# Patient Record
Sex: Female | Born: 1970 | Race: White | Hispanic: No | Marital: Married | Smoking: Never smoker
Health system: Southern US, Community
[De-identification: ages and names within clinical notes are randomized; demographics above are authoritative.]

## PROBLEM LIST (undated history)

## (undated) DIAGNOSIS — Z8742 Personal history of other diseases of the female genital tract: Secondary | ICD-10-CM

## (undated) DIAGNOSIS — L719 Rosacea, unspecified: Secondary | ICD-10-CM

## (undated) DIAGNOSIS — I8390 Asymptomatic varicose veins of unspecified lower extremity: Secondary | ICD-10-CM

## (undated) HISTORY — DX: Rosacea, unspecified: L71.9

## (undated) HISTORY — DX: Asymptomatic varicose veins of unspecified lower extremity: I83.90

---
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
## 2002-01-05 HISTORY — PX: LAPAROSCOPY: SHX197

## 2003-01-06 HISTORY — PX: ABDOMINAL HYSTERECTOMY: SHX81

## 2007-12-18 ENCOUNTER — Ambulatory Visit: Payer: Self-pay | Admitting: Family Medicine

## 2009-12-04 ENCOUNTER — Ambulatory Visit: Payer: Self-pay | Admitting: Vascular Surgery

## 2010-03-25 ENCOUNTER — Encounter (INDEPENDENT_AMBULATORY_CARE_PROVIDER_SITE_OTHER): Payer: BC Managed Care – PPO

## 2010-03-25 ENCOUNTER — Ambulatory Visit (INDEPENDENT_AMBULATORY_CARE_PROVIDER_SITE_OTHER): Payer: BC Managed Care – PPO | Admitting: Vascular Surgery

## 2010-03-25 DIAGNOSIS — I83893 Varicose veins of bilateral lower extremities with other complications: Secondary | ICD-10-CM

## 2010-03-26 NOTE — Assessment & Plan Note (Signed)
OFFICE VISIT  [name] [name], [name] [name] DOB:  11-Feb-1970                                       03/25/2010 CHART#:[id number]  The patient presents today for continued follow-up of her left leg venous pathology.  She is a very pleasant 40-year white female with a long history of progressive pain over varicosities in her left calf.  I had seen her 3 months ago, and she has been wearing thigh-high graduated compression stockings since that time.  She reports no relief of her discomfort.  She does continue to elevate her legs and does take ibuprofen for pain.  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 She also reports exercise, especially tennis, is difficult due to leg pain as well and often she reports this awakens her from sleep at night. Her physical exam is unchanged.  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  She underwent formal venous duplex today in our office, and this showed reflux throughout her small saphenous vein on the left extending into the varicosities.  She does have some reflux at the left saphenofemoral junction, but the vein throughout her thigh appears small and I do not feel this is causing her venous hypertension. I have recommended laser ablation of her small saphenous and stab phlebectomy of multiple tributary varicosities for relief of symptoms. We will proceed with this at her earliest convenience.    Larina Earthly, M.D. Electronically Signed  TFE/MEDQ  D:  03/25/2010  T:  03/26/2010  Job:  [id number]

## 2010-04-01 NOTE — Procedures (Unsigned)
LOWER EXTREMITY VENOUS REFLUX EXAM  INDICATION:  Varicose veins.  EXAM:  Using color-flow imaging and pulse Doppler spectral analysis, the left common femoral, superficial femoral, popliteal, posterior tibial, greater and lesser saphenous veins were evaluated.  There is evidence suggesting deep venous insufficiency in the left lower extremity.  The left saphenofemoral junction is not competent with reflux of >537milliseconds. The left GSV is not competent with Reflux of >511milliseconds with the caliber as described below.  The left proximal small saphenous vein demonstrates incompetency.  The measurements range at 0.59 at the junction, 0.65 within the mid proximal calf, and 0.75 within the distal proximal calf.  GSV Diameter (used if found to be incompetent only)                                           Right        Left Proximal Greater Saphenous Vein           cm           0.45 cm Proximal-to-mid-thigh                     cm           cm Mid thigh                                 cm           cm Mid-distal thigh                          cm           cm Distal thigh                              cm           0.27 cm Knee                                      cm           cm  IMPRESSION: 1. Left greater saphenous vein is not competent with reflux     >553milliseconds. 2. The left greater saphenous vein is not tortuous. 3. The deep venous system is not competent with Reflux of     >568milliseconds. 4. The left small saphenous vein is not competent with Reflux of     >557milliseconds.       ___________________________________________ Larina Earthly, M.D.  OD/MEDQ  D:  03/26/2010  T:  03/26/2010  Job:  [id number]

## 2010-04-09 ENCOUNTER — Other Ambulatory Visit (INDEPENDENT_AMBULATORY_CARE_PROVIDER_SITE_OTHER): Payer: BC Managed Care – PPO | Admitting: Vascular Surgery

## 2010-04-09 DIAGNOSIS — I83893 Varicose veins of bilateral lower extremities with other complications: Secondary | ICD-10-CM

## 2010-04-09 HISTORY — PX: ENDOVENOUS ABLATION SAPHENOUS VEIN W/ LASER: SUR449

## 2010-04-10 NOTE — Assessment & Plan Note (Signed)
OFFICE VISIT  [name] [name], [name] [name] DOB:  November 16, 1970                                       04/09/2010 CHART#:[id number]  Patient underwent treatment today of her left small saphenous vein with ablation from the mid calf to just below the popliteal junction with a small saphenous vein and also 10-20 stab phlebectomies of her posterior calf and medial calf.  She had no immediate complication and will see [place] again in 1 week with repeat duplex evaluation.    Larina Earthly, M.D. Electronically Signed  TFE/MEDQ  D:  04/09/2010  T:  04/10/2010  Job:  [id number]

## 2010-04-16 ENCOUNTER — Encounter (INDEPENDENT_AMBULATORY_CARE_PROVIDER_SITE_OTHER): Payer: BC Managed Care – PPO

## 2010-04-16 ENCOUNTER — Ambulatory Visit (INDEPENDENT_AMBULATORY_CARE_PROVIDER_SITE_OTHER): Payer: BC Managed Care – PPO | Admitting: Vascular Surgery

## 2010-04-16 DIAGNOSIS — I83893 Varicose veins of bilateral lower extremities with other complications: Secondary | ICD-10-CM

## 2010-04-16 DIAGNOSIS — Z48812 Encounter for surgical aftercare following surgery on the circulatory system: Secondary | ICD-10-CM

## 2010-04-17 NOTE — Assessment & Plan Note (Signed)
OFFICE VISIT  [name], [name] DOB:  Jul 14, 1970                                       04/16/2010 CHART#:[id number]  This patient is here today for one week follow-up following laser ablation of her left small saphenous vein, stab phlebectomy of multiple tributary varicosities in her calf.  She done quite well, has minimal discomfort and quite pleased with her result.  She does have the usual amount of mild bruising.  She underwent noninvasive vascular laboratory studies in our office today and this reveals closure of her small saphenous vein from the insertion site to just below the popliteal junction.  She has no evidence of DVT.  I am pleased with the result and we will see her again on an as-needed basis.    Larina Earthly, M.D. Electronically Signed  TFE/MEDQ  D:  04/16/2010  T:  04/17/2010  Job:  [id number]  cc:   Barry Brunner

## 2010-04-29 NOTE — Procedures (Unsigned)
DUPLEX DEEP VENOUS EXAM - LOWER EXTREMITY  INDICATION:  Status post left SSV ablation.  HISTORY:  Edema:  Yes. Trauma/Surgery:  Venous ablation. Pain:  Yes. PE:  No. Previous DVT:  No. Anticoagulants:  No. Other:  DUPLEX EXAM:               CFV   SFV   PopV  PTV    GSV               R  L  R  L  R  L  R   L  R  L Thrombosis                   0      0 Spontaneous                  +      + Phasic                       +      + Augmentation                 +      + Compressible                 +      + Competent                    +      +  Legend:  + - yes  o - no  p - partial  D - decreased  IMPRESSION:  Successful ablation of left short saphenous vein without evidence of deep venous involvement.   _____________________________ Larina Earthly, M.D.  LT/MEDQ  D:  04/16/2010  T:  04/16/2010  Job:  [id number]

## 2010-05-20 NOTE — Consult Note (Signed)
NEW PATIENT CONSULTATION   [name], [name]  DOB:  28-Jan-1970                                       12/04/2009  CHART#:[id number]   This patient presents today for evaluation of left leg venous  hypertension.  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  She reports she has pain  specifically over the varicosities themselves and also a tired achy  sensation with prolonged standing.  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  She has no  other major medical difficulties and no history of DVT or bleeding from  her varicosities.  She does have history of adenoid surgery in 1978,  laparoscopy in 2004 and hysterectomy in 2005.  She does not smoke, has 1  to 2 alcohol drinks per week.   FAMILY HISTORY:  Significant for heart disease in her father and  varicosities in her mother.   REVIEW OF SYSTEMS:  Positive for pain in her legs with walking as per  HPI.  Skin:  She does have rosacea on her face.  Otherwise review of  systems is totally normal except for HPI.   PHYSICAL EXAMINATION:  Well developed, well nourished white female  appearing stated age in no acute distress.  Blood pressure 109/74, pulse  76,  respirations 18.  HEENT is normal.  Her radial and dorsalis pedis  pulses are 2+ bilaterally.  Musculoskeletal:  Shows no major deformities  or cyanosis.  Neurologic:  No focal weakness or paresthesias.  Skin:  Without ulcers or rashes.  She does have tributary varicosities in her  posterior calf on the right and extending around to the medial calf as  well.   She underwent screening ultrasound visualization by myself with SonoSite  and revealed an enlarged saphenous vein with reflux extending into the  varicosities in her left calf.  I discussed the significance of this.  I  explained  the relationship of her venous hypertension due to valvular  incompetence.  We have fitted today with thigh-high graduated  compression garments, 20 to 30 mmHg and instructed her on the use of  these.  Plan to see her again in 3 months to determine if she is having  successful treatment with conservative treatment.  I did explain the  option of laser ablation of her saphenous vein and stab phlebectomy of  tributary varicosities if she should fail conservative treatment.  She  will have a formal left leg venous duplex for reflux in evaluation with  the next visit as well.     Larina Earthly, M.D.  Electronically Signed   TFE/MEDQ  D:  12/04/2009  T:  12/05/2009  Job:  [id number]   cc:   Barry Brunner

## 2012-01-13 ENCOUNTER — Encounter: Payer: Self-pay | Admitting: *Deleted

## 2012-01-13 ENCOUNTER — Encounter: Payer: Self-pay | Admitting: Vascular Surgery

## 2012-01-14 ENCOUNTER — Ambulatory Visit (INDEPENDENT_AMBULATORY_CARE_PROVIDER_SITE_OTHER): Payer: BC Managed Care – PPO | Admitting: Vascular Surgery

## 2012-01-14 ENCOUNTER — Encounter: Payer: Self-pay | Admitting: Vascular Surgery

## 2012-01-14 VITALS — BP 120/77 | HR 83 | Resp 18 | Ht 68.0 in | Wt 194.0 lb

## 2012-01-14 DIAGNOSIS — I83893 Varicose veins of bilateral lower extremities with other complications: Secondary | ICD-10-CM | POA: Insufficient documentation

## 2012-01-14 NOTE — Progress Notes (Signed)
The patient presents today for evaluation of left leg venous pathology. She is well-known to me from a prior treatment of painful varicosities in her posterior left calf. She underwent laser ablation of her left small saphenous vein and stab phlebectomy of tributary varicosities by myself in April of 2012. She's had a good persistent result from this. She did have some telangiectasia over her calf and anterior thigh. She has had sclerotherapy treatment of these and in her dermatology office. She is seen today because she is having suboptimal results for these cosmetic concern. We are seeing her to determine there are no underlying venous pathology causing ongoing problems.  Past Medical History  Diagnosis Date  . Varicose veins   . Rosacea     History  Substance Use Topics  . Smoking status: Never Smoker   . Smokeless tobacco: Never Used  . Alcohol Use: Yes     Comment: 1-2 drinks/week    Family History  Problem Relation Age of Onset  . Heart disease Father     Allergies  Allergen Reactions  . Penicillins Hives    Current outpatient prescriptions:estrogens, conjugated, (PREMARIN) 0.625 MG tablet, Take 0.625 mg by mouth daily. Take daily for 21 days then do not take for 7 days., Disp: , Rfl: ;  loratadine (CLARITIN) 10 MG tablet, Take 10 mg by mouth daily., Disp: , Rfl:   BP 120/77  Pulse 83  Resp 18  Ht 5\' 8"  (1.727 m)  Wt 194 lb (87.998 kg)  BMI 29.50 kg/m2  Body mass index is 29.50 kg/(m^2).       Well-developed well-nourished white female no acute distress 2+ dorsalis pedis pulses bilaterally No evidence of varicosities bilaterally. She does have a venous blush over the medial calf on the left and also on the anterior thigh on the left.  I imaged her veins on the left leg with the SonoSite. This shows closure of her small saphenous vein. She does not have any evidence of varicosities underlying these areas of venous blush over her calf or thigh. Her prior study did  show mild and significant reflux at the left saphenofemoral junction  I discussed this with the patient. I explained it is somewhat difficult treating these venous blush is since difficult to get a needle into them since they are so small. I did explain the cutaneous laser is used to the treatment of choice with this. I do not see any other underlying venous pathology that should be treated. She was reassured this discussion will continue followup with her dermatology office for treatment of these venous blushes.

## 2013-08-01 ENCOUNTER — Ambulatory Visit: Payer: Self-pay | Admitting: Podiatry

## 2015-05-14 IMAGING — US US EXTREM LOW VENOUS*L*
1 series · 13 of 24 positions shown · non-contrast
Comparison: None.

CLINICAL DATA: Left calf pain and swelling.



[Series 1: us extrem low venous*left* · 0.09mm/px · 13 of 32 slices shown]
[im 1/32]
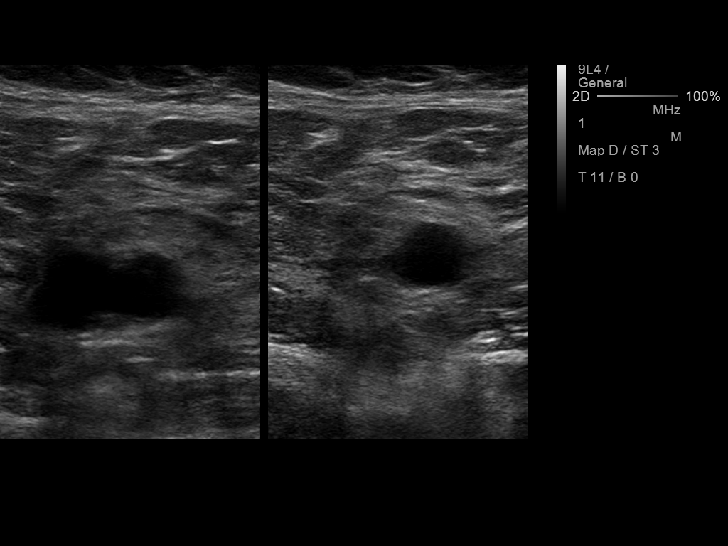
[im 3/32]
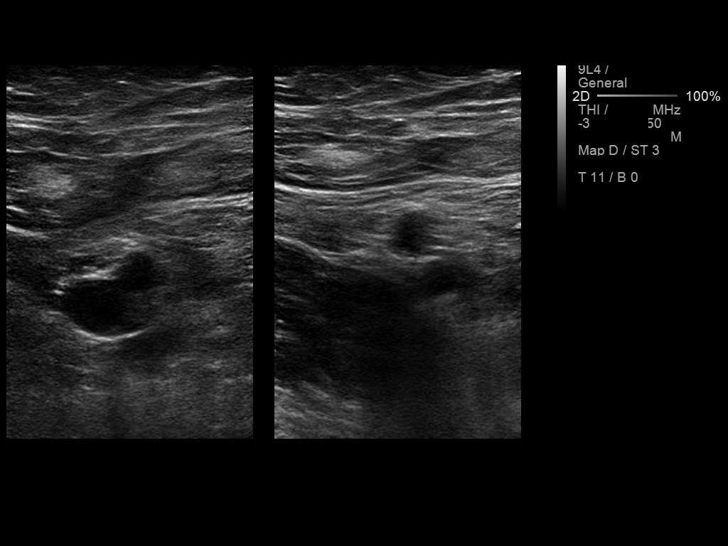
[im 6/32]
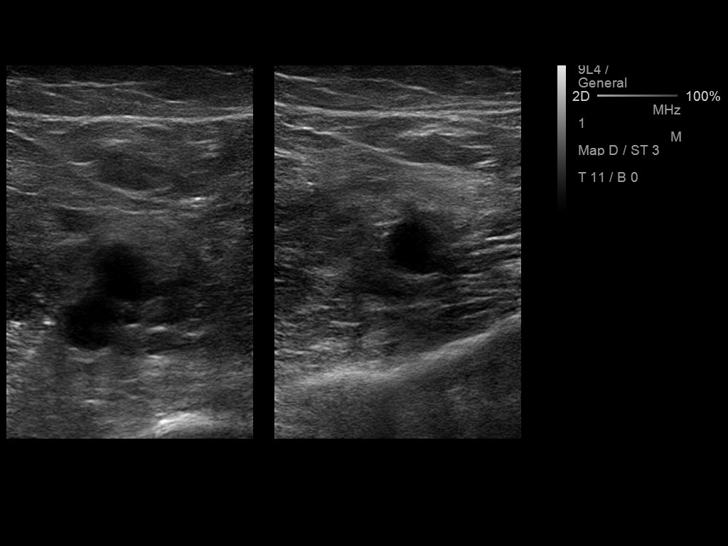
[im 9/32]
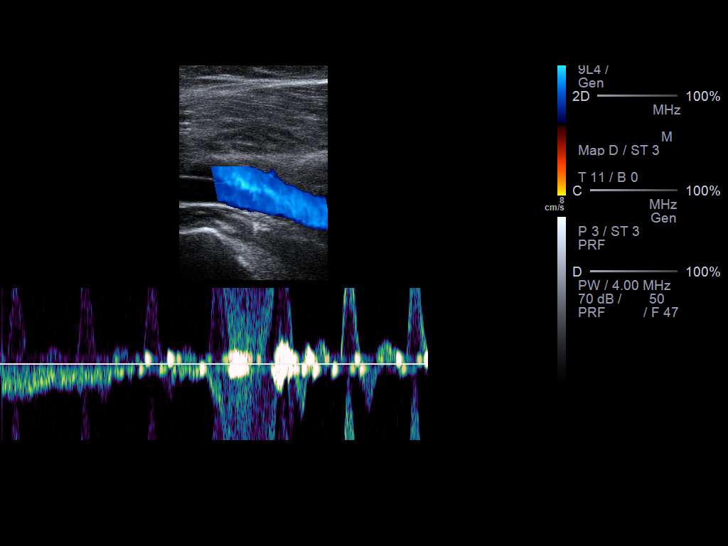
[im 11/32]
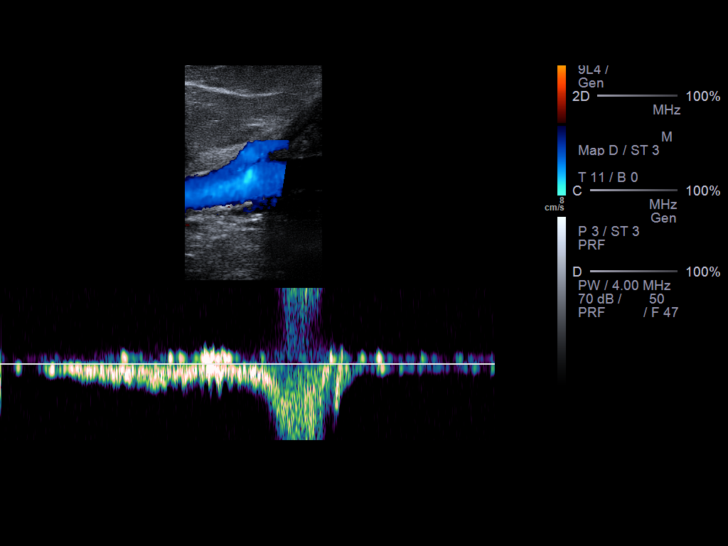
[im 14/32]
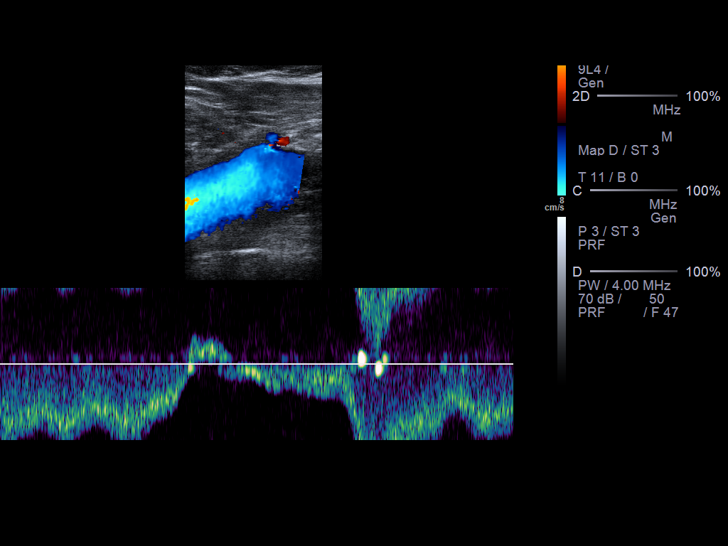
[im 17/32]
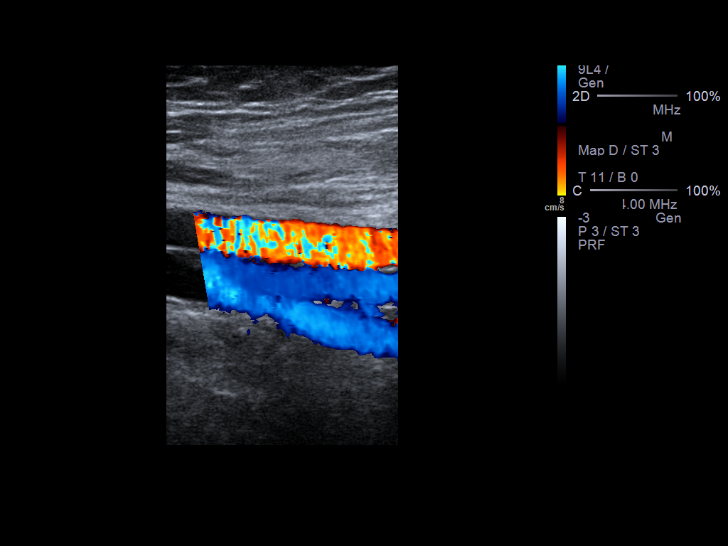
[im 18/32]
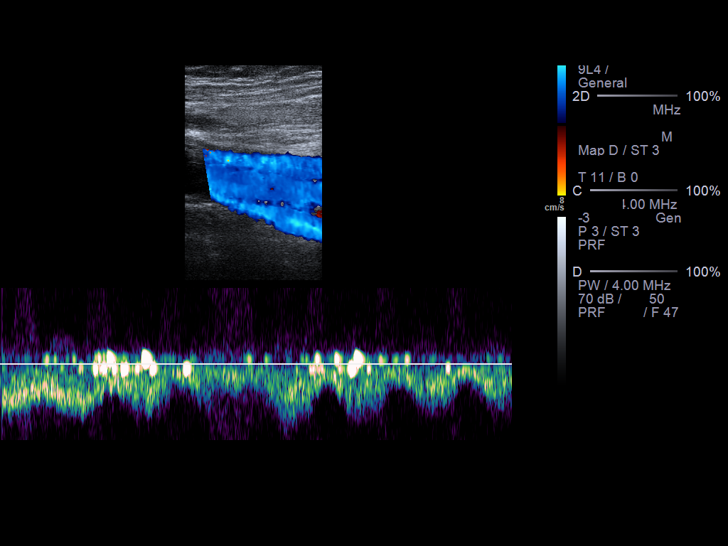
[im 21/32]
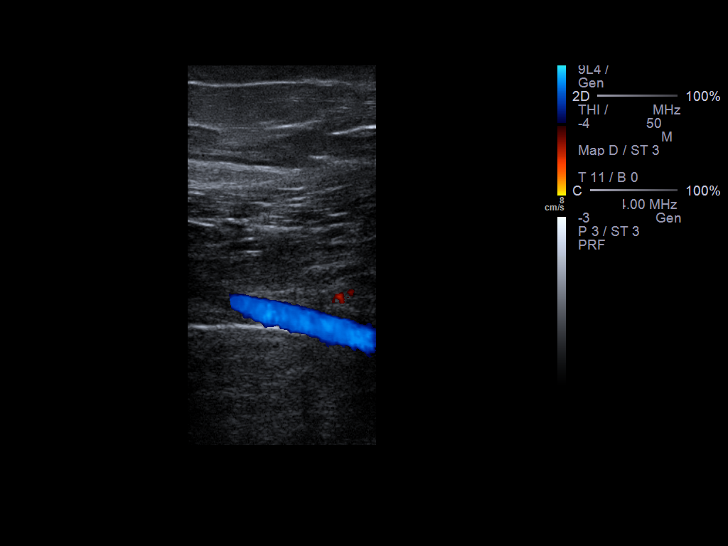
[im 23/32]
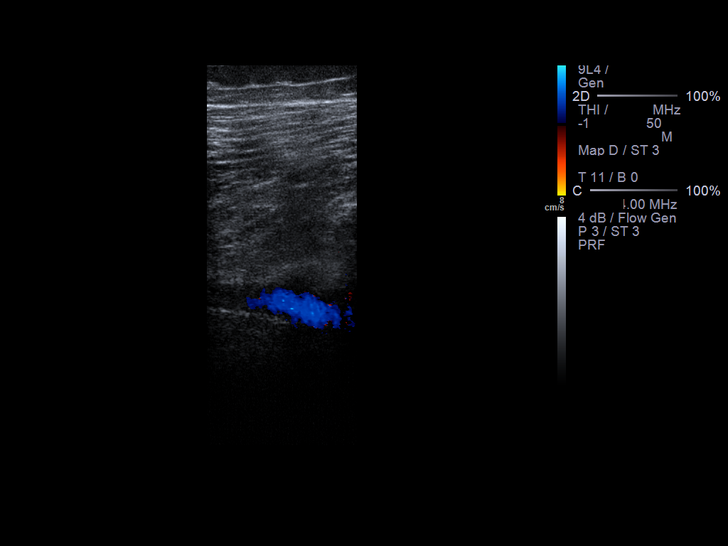
[im 26/32]
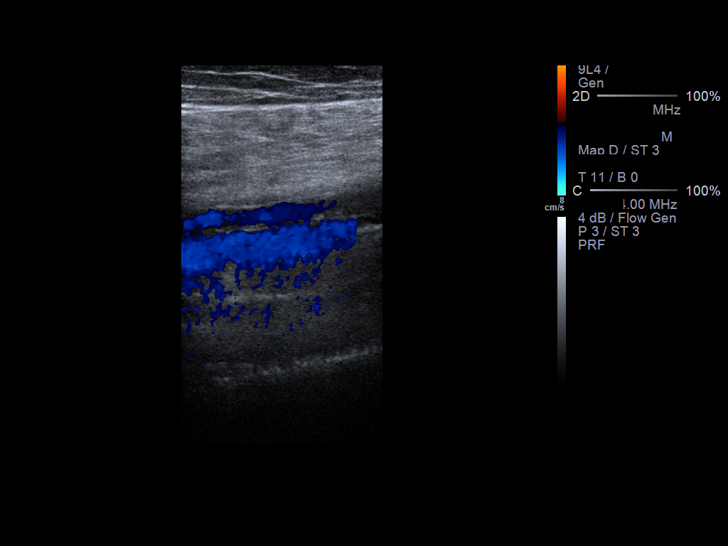
[im 29/32]
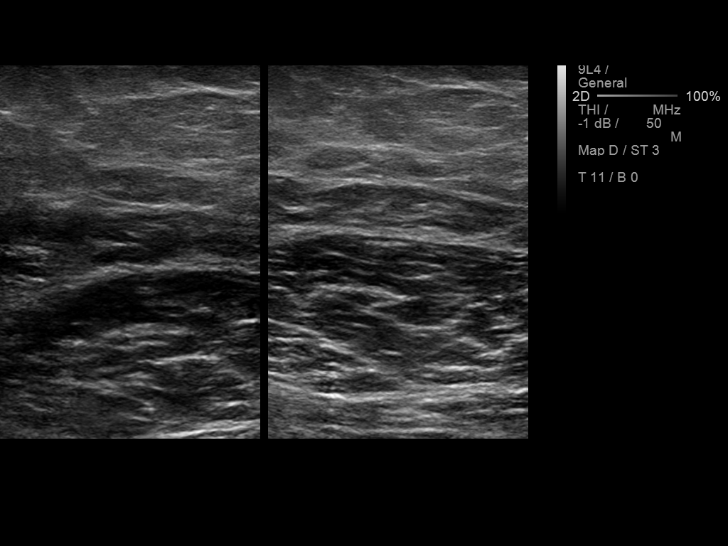
[im 32/32]
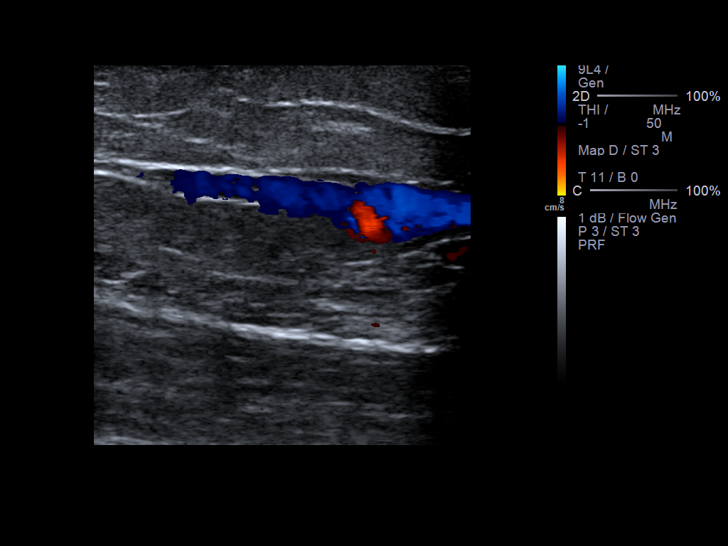

[13 of 24 positions shown; findings below may reference images not displayed]

FINDINGS: Common Femoral Vein: No evidence of thrombus. Normal
compressibility, respiratory phasicity and response to augmentation.

Saphenofemoral Junction: No evidence of thrombus. Normal
compressibility and flow on color Doppler imaging.

Profunda Femoral Vein: No evidence of thrombus. Normal
compressibility and flow on color Doppler imaging.

Femoral Vein: No evidence of thrombus. Normal compressibility,
respiratory phasicity and response to augmentation.

Popliteal Vein: No evidence of thrombus. Normal compressibility,
respiratory phasicity and response to augmentation.

Calf Veins: No evidence of thrombus. Normal compressibility and flow
on color Doppler imaging.

Superficial Great Saphenous Vein: No evidence of thrombus. Normal
compressibility and flow on color Doppler imaging.

Venous Reflux:  None.

Other Findings:  None.
IMPRESSION: No evidence of deep venous thrombosis. Report was called by the
ultrasound technologist at [DATE] p.m.

## 2015-11-22 ENCOUNTER — Other Ambulatory Visit: Payer: Self-pay | Admitting: Family Medicine

## 2015-11-22 DIAGNOSIS — Z780 Asymptomatic menopausal state: Secondary | ICD-10-CM

## 2015-12-16 ENCOUNTER — Ambulatory Visit
Admission: RE | Admit: 2015-12-16 | Discharge: 2015-12-16 | Disposition: A | Discharge status: Home / Self Care | Payer: BC Managed Care – PPO | Source: Ambulatory Visit | Attending: Family Medicine | Admitting: Family Medicine

## 2015-12-16 DIAGNOSIS — Z780 Asymptomatic menopausal state: Secondary | ICD-10-CM | POA: Diagnosis not present

## 2018-01-31 ENCOUNTER — Encounter: Payer: Self-pay | Admitting: *Deleted

## 2018-02-01 ENCOUNTER — Ambulatory Visit
Admission: RE | Admit: 2018-02-01 | Discharge: 2018-02-01 | Disposition: A | Discharge status: Home / Self Care | Payer: BC Managed Care – PPO | Attending: Gastroenterology | Admitting: Gastroenterology

## 2018-02-01 ENCOUNTER — Ambulatory Visit: Payer: BC Managed Care – PPO | Admitting: Anesthesiology

## 2018-02-01 ENCOUNTER — Encounter
Admission: RE | Disposition: A | Discharge status: Home / Self Care | Payer: Self-pay | Source: Home / Self Care | Attending: Gastroenterology

## 2018-02-01 ENCOUNTER — Encounter: Payer: Self-pay | Admitting: Anesthesiology

## 2018-02-01 DIAGNOSIS — Z09 Encounter for follow-up examination after completed treatment for conditions other than malignant neoplasm: Secondary | ICD-10-CM | POA: Diagnosis not present

## 2018-02-01 DIAGNOSIS — K5730 Diverticulosis of large intestine without perforation or abscess without bleeding: Secondary | ICD-10-CM | POA: Insufficient documentation

## 2018-02-01 DIAGNOSIS — Z8371 Family history of colonic polyps: Secondary | ICD-10-CM | POA: Diagnosis not present

## 2018-02-01 DIAGNOSIS — Z7951 Long term (current) use of inhaled steroids: Secondary | ICD-10-CM | POA: Diagnosis not present

## 2018-02-01 DIAGNOSIS — K621 Rectal polyp: Secondary | ICD-10-CM | POA: Insufficient documentation

## 2018-02-01 DIAGNOSIS — R933 Abnormal findings on diagnostic imaging of other parts of digestive tract: Secondary | ICD-10-CM | POA: Diagnosis not present

## 2018-02-01 DIAGNOSIS — K635 Polyp of colon: Secondary | ICD-10-CM | POA: Insufficient documentation

## 2018-02-01 HISTORY — PX: COLONOSCOPY WITH PROPOFOL: SHX5780

## 2018-02-01 HISTORY — DX: Personal history of other diseases of the female genital tract: Z87.42

## 2018-02-01 SURGERY — COLONOSCOPY WITH PROPOFOL
Anesthesia: General

## 2018-02-01 MED ORDER — FENTANYL CITRATE (PF) 100 MCG/2ML IJ SOLN
INTRAMUSCULAR | Status: AC
  Filled 2018-02-01: qty 2

## 2018-02-01 MED ORDER — FENTANYL CITRATE (PF) 100 MCG/2ML IJ SOLN
INTRAMUSCULAR | Status: DC | PRN
  Administered 2018-02-01 (×2): 25 ug via INTRAVENOUS
  Administered 2018-02-01: 50 ug via INTRAVENOUS

## 2018-02-01 MED ORDER — MIDAZOLAM HCL 2 MG/2ML IJ SOLN
INTRAMUSCULAR | Status: AC
  Filled 2018-02-01: qty 2

## 2018-02-01 MED ORDER — MIDAZOLAM HCL 2 MG/2ML IJ SOLN
INTRAMUSCULAR | Status: DC | PRN
  Administered 2018-02-01: 2 mg via INTRAVENOUS

## 2018-02-01 MED ORDER — SODIUM CHLORIDE 0.9 % IV SOLN
INTRAVENOUS | Status: DC
  Administered 2018-02-01: 13:00:00 via INTRAVENOUS

## 2018-02-01 MED ORDER — PROPOFOL 500 MG/50ML IV EMUL
INTRAVENOUS | Status: AC
  Filled 2018-02-01: qty 50

## 2018-02-01 MED ORDER — PROPOFOL 500 MG/50ML IV EMUL
INTRAVENOUS | Status: DC | PRN
  Administered 2018-02-01: 120 ug/kg/min via INTRAVENOUS

## 2018-02-01 NOTE — Anesthesia Postprocedure Evaluation (Signed)
Anesthesia Post Note  Patient: [name] [name]  Procedure(s) Performed: COLONOSCOPY WITH PROPOFOL (N/A )  Patient location during evaluation: Endoscopy Anesthesia Type: General Level of consciousness: awake and alert Pain management: pain level controlled Vital Signs Assessment: post-procedure vital signs reviewed and stable Respiratory status: spontaneous breathing, nonlabored ventilation, respiratory function stable and patient connected to nasal cannula oxygen Cardiovascular status: blood pressure returned to baseline and stable Postop Assessment: no apparent nausea or vomiting Anesthetic complications: no     Last Vitals:  Vitals:   02/01/18 1414 02/01/18 1424  BP: (!) 104/58 (!) 112/57  Pulse: 65 61  Resp: 15 13  Temp:    SpO2: 97% 98%    Last Pain:  Vitals:   02/01/18 1424  TempSrc:   PainSc: 0-No pain                 Haiden Rawlinson S

## 2018-02-01 NOTE — Anesthesia Preprocedure Evaluation (Signed)
Anesthesia Evaluation  Patient identified by MRN, date of birth, ID band Patient awake    Reviewed: Allergy & Precautions, NPO status , Patient's Chart, lab work & pertinent test results, reviewed documented beta blocker date and time   Airway Mallampati: III  TM Distance: >3 FB     Dental  (+) Chipped   Pulmonary           Cardiovascular      Neuro/Psych    GI/Hepatic   Endo/Other    Renal/GU      Musculoskeletal   Abdominal   Peds  Hematology   Anesthesia Other Findings   Reproductive/Obstetrics                             Anesthesia Physical Anesthesia Plan  ASA: II  Anesthesia Plan: General   Post-op Pain Management:    Induction: Intravenous  PONV Risk Score and Plan:   Airway Management Planned:   Additional Equipment:   Intra-op Plan:   Post-operative Plan:   Informed Consent: I have reviewed the patients History and Physical, chart, labs and discussed the procedure including the risks, benefits and alternatives for the proposed anesthesia with the patient or authorized representative who has indicated his/her understanding and acceptance.       Plan Discussed with: CRNA  Anesthesia Plan Comments:         Anesthesia Quick Evaluation  

## 2018-02-01 NOTE — H&P (Signed)
Outpatient short stay form Pre-procedure 02/01/2018 1:15 PM Christena Deem MD  Primary Physician: Dr Angus Palms  Reason for visit: Colonoscopy  History of present illness: Patient is a 48 year old female presenting today for colonoscopy in regards to a recent history, about 6 weeks ago of an episode of diverticulitis.  Was treated at that time with antibiotic.  She has done well since then with the exception of one episode of some rectal bleeding bout 2 weeks ago.  She has no abdominal pain or or nausea.  There is no history of chronic diarrhea.  The family history of colon polyps with her brother.  He had her diverticulitis there was an abnormal area of thickening in the sigmoid colon.  She tolerated her prep well.  She takes no aspirin or blood thinning agent.    Current Facility-Administered Medications:  .  0.9 %  sodium chloride infusion, , Intravenous, Continuous, Christena Deem, MD, Last Rate: 20 mL/hr at 02/01/18 1308  Medications Prior to Admission  Medication Sig Dispense Refill Last Dose  . estrogens, conjugated, (PREMARIN) 0.625 MG tablet Take 0.625 mg by mouth daily. Take daily for 21 days then do not take for 7 days.   02/01/2018 at Unknown time  . fluticasone (FLONASE) 50 MCG/ACT nasal spray Place 1 spray into both nostrils daily.     Marland Kitchen loratadine (CLARITIN) 10 MG tablet Take 10 mg by mouth daily.   02/01/2018 at Unknown time     Allergies  Allergen Reactions  . Doxycycline   . Penicillins Hives     Past Medical History:  Diagnosis Date  . History of endometriosis   . Rosacea   . Varicose veins     Review of systems:      Physical Exam    Heart and lungs: Rhythm without rub or gallop, lungs are bilaterally clear.    HEENT: Normocephalic atraumatic eyes are anicteric    Other:    Pertinant exam for procedure: Soft nontender nondistended bowel sounds positive normoactive    Planned proceedures: Colonoscopy and indicated procedures. I have  discussed the risks benefits and complications of procedures to include not limited to bleeding, infection, perforation and the risk of sedation and the patient wishes to proceed.    Christena Deem, MD Gastroenterology 02/01/2018  1:15 PM

## 2018-02-01 NOTE — Transfer of Care (Signed)
Immediate Anesthesia Transfer of Care Note  Patient: [name] [name]  Procedure(s) Performed: COLONOSCOPY WITH PROPOFOL (N/A )  Patient Location: PACU  Anesthesia Type:General  Level of Consciousness: awake and sedated  Airway & Oxygen Therapy: Patient Spontanous Breathing and Patient connected to nasal cannula oxygen  Post-op Assessment: Report given to RN and Post -op Vital signs reviewed and stable  Post vital signs: Reviewed and stable  Last Vitals:  Vitals Value Taken Time  BP    Temp    Pulse    Resp    SpO2      Last Pain:  Vitals:   02/01/18 1247  TempSrc: Tympanic  PainSc: 0-No pain         Complications: No apparent anesthesia complications

## 2018-02-01 NOTE — Anesthesia Post-op Follow-up Note (Signed)
Anesthesia QCDR form completed.        

## 2018-02-01 NOTE — Op Note (Signed)
[facility] Patient Name: [name] [name] Procedure Date: 02/01/2018 1:10 PM MRN: [id number] Account #: [id number] Date of Birth: 08-03-1970 Admit Type: Outpatient Age: [age] Room[facility] ROOM 3 Gender: Female Note Status: Finalized Procedure:            Colonoscopy Indications:          Abnormal CT of the GI tract, Follow-up of diverticulitis Providers:            Christena DeemMartin U. Juvia Aerts, MD Medicines:            Monitored Anesthesia Care Complications:        No immediate complications. Procedure:            Pre-Anesthesia Assessment:                       - ASA Grade Assessment: II - A patient with mild                        systemic disease.                       After obtaining informed consent, the colonoscope was                        passed under direct vision. Throughout the procedure,                        the patient's blood pressure, pulse, and oxygen                        saturations were monitored continuously. The                        Colonoscope was introduced through the anus and                        advanced to the the cecum, identified by appendiceal                        orifice and ileocecal valve. The colonoscopy was                        performed without difficulty. The patient tolerated the                        procedure well. The quality of the bowel preparation                        was good. Findings:      Multiple small to medium diverticula were found in the sigmoid colon,       descending colon, transverse colon and ascending colon. No evidence of       diverticulitis.      A 5 mm polyp was found in the proximal rectum. The polyp was       semi-pedunculated. The polyp was removed with a cold snare. Resection       and retrieval were complete.      Two sessile polyps were found in the mid sigmoid colon. The polyps were       1 to 2 mm in size. These polyps were removed with a cold biopsy forceps.  Resection and retrieval were complete.      The retroflexed view of the distal rectum and anal verge was normal and       showed no anal or rectal abnormalities.      The exam was otherwise without abnormality. Impression:           - Diverticulosis in the sigmoid colon, in the                        descending colon, in the transverse colon and in the                        ascending colon.                       - One 5 mm polyp in the rectum, removed with a cold                        snare. Resected and retrieved.                       - Two 1 to 2 mm polyps in the mid sigmoid colon,                        removed with a cold biopsy forceps. Resected and                        retrieved.                       - The distal rectum and anal verge are normal on                        retroflexion view.                       - The examination was otherwise normal. Recommendation:       - Discharge patient to home.                       - Await pathology results. Procedure Code(s):    --- Professional ---                       [id number], Colonoscopy, flexible; with removal of tumor(s),                        polyp(s), or other lesion(s) by snare technique                       45380, 59, Colonoscopy, flexible; with biopsy, single                        or multiple Diagnosis Code(s):    --- Professional ---                       K62.1, Rectal polyp                       D12.5, Benign neoplasm of sigmoid colon                       K57.32,  Diverticulitis of large intestine without                        perforation or abscess without bleeding                       K57.30, Diverticulosis of large intestine without                        perforation or abscess without bleeding                       R93.3, Abnormal findings on diagnostic imaging of other                        parts of digestive tract CPT copyright 2018 American Medical Association. All rights reserved. The codes documented in  this report are preliminary and upon coder review may  be revised to meet current compliance requirements. Christena DeemMartin U Angelea Penny, MD 02/01/2018 2:03:50 PM This report has been signed electronically. Number of Addenda: 0 Note Initiated On: 02/01/2018 1:10 PM Scope Withdrawal Time: 0 hours 13 minutes 45 seconds  Total Procedure Duration: 0 hours 31 minutes 2 seconds       [facility]

## 2018-02-01 NOTE — Anesthesia Procedure Notes (Signed)
Performed by: Cook-Martin, Dennis Hegeman Pre-anesthesia Checklist: Patient identified, Emergency Drugs available, Suction available, Patient being monitored and Timeout performed Patient Re-evaluated:Patient Re-evaluated prior to induction Oxygen Delivery Method: Nasal cannula Preoxygenation: Pre-oxygenation with 100% oxygen Induction Type: IV induction Placement Confirmation: positive ETCO2 and CO2 detector       

## 2018-02-02 ENCOUNTER — Encounter: Payer: Self-pay | Admitting: Gastroenterology

## 2018-02-03 LAB — SURGICAL PATHOLOGY

## 2019-03-04 ENCOUNTER — Ambulatory Visit: Payer: BC Managed Care – PPO | Attending: Internal Medicine

## 2019-03-04 DIAGNOSIS — Z23 Encounter for immunization: Secondary | ICD-10-CM

## 2019-03-04 NOTE — Progress Notes (Signed)
   Covid-19 Vaccination Clinic  Name:  [name] [name]    MRN: [id number] DOB: 1970/06/11  03/04/2019  [name] was observed post Covid-19 immunization for 15 minutes without incidence. She was provided with Vaccine Information Sheet and instruction to access the V-Safe system.   [name] was instructed to call 911 with any severe reactions post vaccine: Marland Kitchen Difficulty breathing  . Swelling of your face and throat  . A fast heartbeat  . A bad rash all over your body  . Dizziness and weakness    Immunizations Administered    Name Date Dose VIS Date Route   Moderna COVID-19 Vaccine 03/04/2019 10:44 AM 0.5 mL 12/06/2018 Intramuscular   Manufacturer: Moderna   Lot: [id number]   NDC: 72091-980-22

## 2019-04-01 ENCOUNTER — Ambulatory Visit: Payer: BC Managed Care – PPO | Attending: Internal Medicine

## 2019-04-01 DIAGNOSIS — Z23 Encounter for immunization: Secondary | ICD-10-CM

## 2019-04-01 NOTE — Progress Notes (Signed)
   Covid-19 Vaccination Clinic  Name:  [name] [name]    MRN: [id number] DOB: 05-15-1970  04/01/2019  [name] was observed post Covid-19 immunization for 30 minutes based on pre-vaccination screening without incident. She was provided with Vaccine Information Sheet and instruction to access the V-Safe system.   [name] was instructed to call 911 with any severe reactions post vaccine: Marland Kitchen Difficulty breathing  . Swelling of face and throat  . A fast heartbeat  . A bad rash all over body  . Dizziness and weakness   Immunizations Administered    Name Date Dose VIS Date Route   Moderna COVID-19 Vaccine 04/01/2019  8:22 AM 0.5 mL 12/06/2018 Intramuscular   Manufacturer: Moderna   Lot: [id number]   NDC: 10211-173-56

## 2019-04-05 ENCOUNTER — Ambulatory Visit: Payer: BC Managed Care – PPO

## 2021-06-26 ENCOUNTER — Other Ambulatory Visit: Payer: Self-pay | Admitting: Family Medicine

## 2021-06-26 DIAGNOSIS — Z79890 Hormone replacement therapy (postmenopausal): Secondary | ICD-10-CM

## 2021-08-07 ENCOUNTER — Ambulatory Visit
Admission: RE | Admit: 2021-08-07 | Discharge: 2021-08-07 | Disposition: A | Discharge status: Home / Self Care | Payer: BC Managed Care – PPO | Source: Ambulatory Visit | Attending: Family Medicine | Admitting: Family Medicine

## 2021-08-07 DIAGNOSIS — Z13820 Encounter for screening for osteoporosis: Secondary | ICD-10-CM | POA: Insufficient documentation

## 2021-08-07 DIAGNOSIS — Z79890 Hormone replacement therapy (postmenopausal): Secondary | ICD-10-CM | POA: Diagnosis present

## 2021-08-07 DIAGNOSIS — Z780 Asymptomatic menopausal state: Secondary | ICD-10-CM | POA: Diagnosis not present

## 2022-03-01 ENCOUNTER — Ambulatory Visit
Admission: RE | Admit: 2022-03-01 | Discharge: 2022-03-01 | Disposition: A | Discharge status: Home / Self Care | Payer: BC Managed Care – PPO | Source: Ambulatory Visit | Attending: Family Medicine | Admitting: Family Medicine

## 2022-03-01 VITALS — BP 114/80 | HR 94 | Temp 99.6°F | Resp 18 | Wt 201.0 lb

## 2022-03-01 DIAGNOSIS — J020 Streptococcal pharyngitis: Secondary | ICD-10-CM | POA: Diagnosis not present

## 2022-03-01 LAB — GROUP A STREP BY PCR: Group A Strep by PCR: DETECTED — AB

## 2022-03-01 MED ORDER — AZITHROMYCIN 250 MG PO TABS: 250.0000 mg | ORAL_TABLET | Freq: Every day | ORAL | 0 refills | Status: DC

## 2022-03-01 NOTE — Discharge Instructions (Signed)
You have strep throat.  Stop by the pharmacy to pick up your prescriptions.  Follow up with your primary care provider as needed.

## 2022-03-01 NOTE — ED Provider Notes (Signed)
[facility]    CSN: [id number] Arrival date & time: 03/01/22  [id number]      History   Chief Complaint Chief Complaint  Patient presents with   Sore Throat    Fever chills - Entered by patient   Fever    HPI [name] [name] is a 52 y.o. female.   HPI   [name] [name] presents for sore throat since Friday afternoon. Has body aches, fever, chills, ear pain and headache. No coughing.  Tried Advil and Tylenol with some help. She felt warm but didn't take her temperature.  She last took Tylenol around 3 AM. There is no vomiting or diarrhea. She no appetite but she is trying hydrated with water and ginger ale. No nausea.     Past Medical History:  Diagnosis Date   History of endometriosis    Rosacea    Varicose veins     Patient Active Problem List   Diagnosis Date Noted   Varicose veins of lower extremities with other complications [id number]    Past Surgical History:  Procedure Laterality Date   ABDOMINAL HYSTERECTOMY  2005   adenoid surgery  1978   COLONOSCOPY WITH PROPOFOL N/A 02/01/2018   Procedure: COLONOSCOPY WITH PROPOFOL;  Surgeon: Lollie Sails, MD;  Location: [facility] ENDOSCOPY;  Service: Endoscopy;  Laterality: N/A;   ENDOVENOUS ABLATION SAPHENOUS VEIN W/ LASER  04-09-2010   left small saphenous vein and stab phlebectomy 10-20 incisions left leg by Curt Jews MD   LAPAROSCOPY  2004    OB History   No obstetric history on file.      Home Medications    Prior to Admission medications   Medication Sig Start Date End Date Taking? Authorizing Provider  azithromycin (ZITHROMAX Z-PAK) 250 MG tablet Take 1 tablet (250 mg total) by mouth daily. Take 2 tablets on day 1 03/01/22  Yes Aditi Rovira, DO  Cholecalciferol (VITAMIN D3) 50 MCG (2000 UT) CAPS Vitamin D3   Yes [provider]  estrogens, conjugated, (PREMARIN) 0.625 MG tablet Take 0.625 mg by mouth daily. Take daily for 21 days then do not take for 7 days.   Yes [provider]   FINACEA 15 % FOAM Apply topically daily.   Yes [provider]  fluticasone (FLONASE) 50 MCG/ACT nasal spray Place 1 spray into both nostrils daily.    [provider]  loratadine (CLARITIN) 10 MG tablet Take 10 mg by mouth daily.    [provider]    Family History Family History  Problem Relation Age of Onset   Heart disease Father     Social History Social History   Tobacco Use   Smoking status: Never    Passive exposure: Never   Smokeless tobacco: Never  Vaping Use   Vaping Use: Never used  Substance Use Topics   Alcohol use: Yes    Comment: 1-2 drinks/week   Drug use: No     Allergies   Doxycycline and Penicillins   Review of Systems Review of Systems: negative unless otherwise stated in HPI.      Physical Exam Triage Vital Signs ED Triage Vitals  Enc Vitals Group     BP 03/01/22 0900 114/80     Pulse Rate 03/01/22 0900 94     Resp 03/01/22 0900 18     Temp 03/01/22 0900 99.6 F (37.6 C)     Temp Source 03/01/22 0900 Oral     SpO2 03/01/22 0900 95 %     Weight  03/01/22 0857 201 lb (91.2 kg)     Height --      Head Circumference --      Peak Flow --      Pain Score 03/01/22 0856 7     Pain Loc --      Pain Edu? --      Excl. in [place]? --    No data found.  Updated Vital Signs BP 114/80 (BP Location: Right Arm)   Pulse 94   Temp 99.6 F (37.6 C) (Oral)   Resp 18   Wt 91.2 kg   SpO2 95%   BMI 30.56 kg/m   Visual Acuity Right Eye Distance:   Left Eye Distance:   Bilateral Distance:    Right Eye Near:   Left Eye Near:    Bilateral Near:     Physical Exam GEN:     alert, ill but non-toxic appearing female in no distress    HENT:  mucus membranes moist, oropharyngeal with exudates and 2+ tonsillar hypertrophy, moderate oropharyngeal erythema,  clear nasal discharge, bilateral TM normal EYES:   pupils equal and reactive, no scleral injection or discharge NECK:  normal ROM, anterior lymphadenopathy, no  meningismus   RESP:  no increased work of breathing, clear to auscultation bilaterally CVS:   regular rate and rhythm Skin:   warm and dry    UC Treatments / Results  Labs (all labs ordered are listed, but only abnormal results are displayed) Labs Reviewed  GROUP A STREP BY PCR - Abnormal; Notable for the following components:      Result Value   Group A Strep by PCR DETECTED (*)    All other components within normal limits    EKG   Radiology No results found.  Procedures Procedures (including critical care time)  Medications Ordered in UC Medications - No data to display  Initial Impression / Assessment and Plan / UC Course  I have reviewed the triage vital signs and the nursing notes.  Pertinent labs & imaging results that were available during my care of the patient were reviewed by me and considered in my medical decision making (see chart for details).       Pt is a 52 y.o. female who presents for 2-3 days of respiratory symptoms. [name] has an elevated temperature here, 99.6 F. [name] adequately on room air. Overall pt is ill but non-toxic appearing, well hydrated, without respiratory distress. Pulmonary exam is unremarkable.  Strep PCR is positive.  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  Treat with azithromycin as below.  Discussed symptomatic treatment.  Advil and Tylenol as needed for pain.  Typical duration of symptoms discussed.  Work note provided.  Return and ED precautions given and voiced understanding. Discussed MDM, treatment plan and plan for follow-up with patient who agrees with plan.     Final Clinical Impressions(s) / UC Diagnoses   Final diagnoses:  Strep pharyngitis     Discharge Instructions      You have strep throat.  Stop by the pharmacy to pick up your prescriptions.  Follow up with your primary care provider as needed.      ED Prescriptions     Medication Sig Dispense Auth. Provider   azithromycin  (ZITHROMAX Z-PAK) 250 MG tablet Take 1 tablet (250 mg total) by mouth daily. Take 2 tablets on day 1 6 tablet Jiana Lemaire, DO      PDMP not reviewed this encounter.  Lyndee Hensen, DO 03/01/22 1019

## 2022-03-01 NOTE — ED Triage Notes (Signed)
Pt states she has had fever and sore throat body aches chills since Friday evening. Advil and tylenol. Nothing since 3 am.

## 2022-06-26 ENCOUNTER — Ambulatory Visit: Payer: Self-pay

## 2023-06-06 DIAGNOSIS — N390 Urinary tract infection, site not specified: Secondary | ICD-10-CM

## 2023-06-06 HISTORY — DX: Urinary tract infection, site not specified: N39.0

## 2023-07-24 ENCOUNTER — Ambulatory Visit (INDEPENDENT_AMBULATORY_CARE_PROVIDER_SITE_OTHER)

## 2023-07-24 ENCOUNTER — Ambulatory Visit: Admission: RE | Admit: 2023-07-24 | Discharge: 2023-07-24 | Disposition: A | Discharge status: Home / Self Care

## 2023-07-24 VITALS — BP 124/81 | HR 74 | Temp 98.2°F

## 2023-07-24 DIAGNOSIS — R110 Nausea: Secondary | ICD-10-CM

## 2023-07-24 DIAGNOSIS — R1032 Left lower quadrant pain: Secondary | ICD-10-CM

## 2023-07-24 DIAGNOSIS — K5790 Diverticulosis of intestine, part unspecified, without perforation or abscess without bleeding: Secondary | ICD-10-CM | POA: Diagnosis not present

## 2023-07-24 DIAGNOSIS — K5732 Diverticulitis of large intestine without perforation or abscess without bleeding: Secondary | ICD-10-CM | POA: Diagnosis not present

## 2023-07-24 LAB — CBC WITH DIFFERENTIAL/PLATELET
Abs Immature Granulocytes: 0.03 K/uL (ref 0.00–0.07)
Basophils Absolute: 0 K/uL (ref 0.0–0.1)
Basophils Relative: 1 %
Eosinophils Absolute: 0.1 K/uL (ref 0.0–0.5)
Eosinophils Relative: 1 %
HCT: 39.3 % (ref 36.0–46.0)
Hemoglobin: 13.6 g/dL (ref 12.0–15.0)
Immature Granulocytes: 0 %
Lymphocytes Relative: 17 %
Lymphs Abs: 1.5 K/uL (ref 0.7–4.0)
MCH: 30.6 pg (ref 26.0–34.0)
MCHC: 34.6 g/dL (ref 30.0–36.0)
MCV: 88.5 fL (ref 80.0–100.0)
Monocytes Absolute: 0.7 K/uL (ref 0.1–1.0)
Monocytes Relative: 7 %
Neutro Abs: 6.5 K/uL (ref 1.7–7.7)
Neutrophils Relative %: 74 %
Platelets: 258 K/uL (ref 150–400)
RBC: 4.44 MIL/uL (ref 3.87–5.11)
RDW: 14 % (ref 11.5–15.5)
WBC: 8.8 K/uL (ref 4.0–10.5)
nRBC: 0 % (ref 0.0–0.2)

## 2023-07-24 LAB — COMPREHENSIVE METABOLIC PANEL WITH GFR
ALT: 15 U/L (ref 0–44)
AST: 14 U/L — ABNORMAL LOW (ref 15–41)
Albumin: 4 g/dL (ref 3.5–5.0)
Alkaline Phosphatase: 87 U/L (ref 38–126)
Anion gap: 8 (ref 5–15)
BUN: 15 mg/dL (ref 6–20)
CO2: 28 mmol/L (ref 22–32)
Calcium: 9.1 mg/dL (ref 8.9–10.3)
Chloride: 100 mmol/L (ref 98–111)
Creatinine, Ser: 0.64 mg/dL (ref 0.44–1.00)
GFR, Estimated: >60 mL/min (ref >60)
Glucose, Bld: 89 mg/dL (ref 70–99)
Potassium: 4.2 mmol/L (ref 3.5–5.1)
Sodium: 136 mmol/L (ref 135–145)
Total Bilirubin: 0.7 mg/dL (ref 0.0–1.2)
Total Protein: 7.8 g/dL (ref 6.5–8.1)

## 2023-07-24 LAB — URINALYSIS, W/ REFLEX TO CULTURE (INFECTION SUSPECTED)
Bacteria, UA: NONE SEEN
Bilirubin Urine: NEGATIVE
Glucose, UA: NEGATIVE mg/dL
Ketones, ur: NEGATIVE mg/dL
Leukocytes,Ua: NEGATIVE
Nitrite: NEGATIVE
Protein, ur: NEGATIVE mg/dL
Specific Gravity, Urine: 1.01 (ref 1.005–1.030)
WBC, UA: NONE SEEN WBC/hpf (ref 0–5)
pH: 6 (ref 5.0–8.0)

## 2023-07-24 MED ORDER — CIPROFLOXACIN HCL 750 MG PO TABS: 750.0000 mg | ORAL_TABLET | Freq: Two times a day (BID) | ORAL | 0 refills | Status: AC

## 2023-07-24 MED ORDER — METRONIDAZOLE 500 MG PO TABS: 500.0000 mg | ORAL_TABLET | Freq: Four times a day (QID) | ORAL | 0 refills | Status: AC

## 2023-07-24 NOTE — ED Triage Notes (Signed)
 Onset Thursday.  Reported as periumbilical initially gradually moving to left lower quadrant.  Pain is constant and worse with activity. Tylenol helps. Pain is reported as sharp. Occasionally dull and achy.  Denies vomiting.  Mild intermittent nausea.  Denies diarrhea or constipation.  Denies fever.  Reports chills at night.  Hx of diverticulitis 6 years ago

## 2023-07-24 NOTE — ED Provider Notes (Signed)
 [facility]    CSN: [id number] Arrival date & time: 07/24/23  1331      History   Chief Complaint Chief Complaint  Patient presents with   Abdominal Pain    Entered by patient    HPI [name] [name] is a 53 y.o. female with history of endometriosis s/p abdominal hysterectomy, diverticulosis/diverticulitis, allergies, rosacea, and varicose veins presents for 3 day history of worsening dull/achy left lower quadrant abdominal pain with intermittent nausea, chills, and fatigue. Patient says symptoms began with more generalized upper abdominal pain and then localized to LLQ. Pain initially intermittent, but now more constant. Rates pain at 5/10. Reports normal BM today. Had urinary urgency 1 week ago but not now. Denies fever, vomiting, diarrhea, constipation, blood in stool, dysuria, urinary frequency, flank pain, body aches. Has taken Tylenol for pain which helped slightly. No other concerns.  HPI  Past Medical History:  Diagnosis Date   History of endometriosis    Rosacea    Varicose veins     Patient Active Problem List   Diagnosis Date Noted   Varicose veins of bilateral lower extremities with other complications 01/14/2012    Past Surgical History:  Procedure Laterality Date   ABDOMINAL HYSTERECTOMY  2005   adenoid surgery  1978   COLONOSCOPY WITH PROPOFOL  N/A 02/01/2018   Procedure: COLONOSCOPY WITH PROPOFOL ;  Surgeon: Gaylyn Gladis PENNER, MD;  Location: [facility] ENDOSCOPY;  Service: Endoscopy;  Laterality: N/A;   ENDOVENOUS ABLATION SAPHENOUS VEIN W/ LASER  04-09-2010   left small saphenous vein and stab phlebectomy 10-20 incisions left leg by Krystal Doing MD   LAPAROSCOPY  2004    OB History   No obstetric history on file.      Home Medications    Prior to Admission medications   Medication Sig Start Date End Date Taking? Authorizing Provider  Cholecalciferol (VITAMIN D3) 50 MCG (2000 UT) CAPS Vitamin D3   Yes [provider]  estradiol  (ESTRACE) 1 MG tablet Take 1.5 mg by mouth daily. 06/01/19  Yes [provider]  FINACEA 15 % FOAM Apply topically daily.   Yes [provider]  fluticasone (FLONASE) 50 MCG/ACT nasal spray Place 1 spray into both nostrils daily.   Yes [provider]  loratadine (CLARITIN) 10 MG tablet Take 10 mg by mouth daily.   Yes [provider]  azithromycin  (ZITHROMAX  Z-PAK) 250 MG tablet Take 1 tablet (250 mg total) by mouth daily. Take 2 tablets on day 1 03/01/22   Kriste Berth, DO    Family History Family History  Problem Relation Age of Onset   Heart disease Father     Social History Social History   Tobacco Use   Smoking status: Never    Passive exposure: Never   Smokeless tobacco: Never  Vaping Use   Vaping status: Never Used  Substance Use Topics   Alcohol use: Yes    Comment: 1-2 drinks/week   Drug use: No     Allergies   Doxycycline and Penicillins   Review of Systems Review of Systems  Constitutional:  Positive for chills and fatigue. Negative for appetite change, diaphoresis and fever.  HENT:  Negative for congestion.   Respiratory:  Negative for cough and shortness of breath.   Cardiovascular:  Negative for chest pain.  Gastrointestinal:  Positive for abdominal pain and nausea. Negative for abdominal distention, anal bleeding, blood in stool, constipation, diarrhea and vomiting.  Genitourinary:  Positive for urgency. Negative for difficulty urinating, dysuria,  frequency, pelvic pain and vaginal discharge.  Musculoskeletal:  Negative for back pain and myalgias.  Skin:  Negative for rash.  Neurological:  Negative for dizziness, weakness and headaches.     Physical Exam Triage Vital Signs ED Triage Vitals  Encounter Vitals Group     BP 07/24/23 1343 124/81     Girls Systolic BP Percentile --      Girls Diastolic BP Percentile --      Boys Systolic BP Percentile --      Boys Diastolic BP Percentile --      Pulse Rate 07/24/23  1343 74     Resp --      Temp 07/24/23 1343 98.2 F (36.8 C)     Temp Source 07/24/23 1343 Oral     SpO2 07/24/23 1343 97 %     Weight --      Height --      Head Circumference --      Peak Flow --      Pain Score 07/24/23 1345 6     Pain Loc --      Pain Education --      Exclude from Growth Chart --    No data found.  Updated Vital Signs BP 124/81 (BP Location: Right Arm)   Pulse 74   Temp 98.2 F (36.8 C) (Oral)   SpO2 97%      Physical Exam Vitals and nursing note reviewed.  Constitutional:      General: She is not in acute distress.    Appearance: Normal appearance. She is well-developed. She is not ill-appearing or toxic-appearing.  HENT:     Head: Normocephalic and atraumatic.     Nose: Nose normal.     Mouth/Throat:     Mouth: Mucous membranes are moist.     Pharynx: Oropharynx is clear.  Eyes:     General: No scleral icterus.       Right eye: No discharge.        Left eye: No discharge.     Conjunctiva/sclera: Conjunctivae normal.  Cardiovascular:     Rate and Rhythm: Normal rate and regular rhythm.     Heart sounds: Normal heart sounds.  Pulmonary:     Effort: Pulmonary effort is normal. No respiratory distress.     Breath sounds: Normal breath sounds.  Abdominal:     General: There is no distension.     Palpations: Abdomen is soft.     Tenderness: There is abdominal tenderness in the left upper quadrant and left lower quadrant. There is no right CVA tenderness, left CVA tenderness, guarding or rebound.  Musculoskeletal:     Cervical back: Neck supple.  Skin:    General: Skin is dry.  Neurological:     General: No focal deficit present.     Mental Status: She is alert. Mental status is at baseline.     Motor: No weakness.     Gait: Gait normal.  Psychiatric:        Mood and Affect: Mood normal.        Behavior: Behavior normal.      UC Treatments / Results  Labs (all labs ordered are listed, but only abnormal results are displayed) Labs  Reviewed - No data to display  EKG   Radiology No results found.   11/05/2021 5:30 PM EDT  CT ABDOMEN PELVIS WITH CONTRAST  Indication: RLQ abdominal pain (Age >= 14y), R10.31 Right lower quadrant pain  Comparison: 12/07/2017  Technique: Contiguous  axial images of the abdomen and pelvis were performed following the IV administration of 100 mL Isovue-300 given the indications for the exam. Dose reduction was obtained with either Automated Exposure Control (AEC) or, if AEC could not be utilized, by adjustment of the mA and/or kV according to patient size.  Findings: Lung bases: Unremarkable.  Liver/gallbladder: Normal in attenuation. A few tiny hypodense lesions are too small to characterize but appears stable. The gallbladder is normal.  Pancreas: No peripancreatic fat stranding or ductal dilation.  Spleen: Unremarkable.  Adrenal glands: Unremarkable.  Kidneys: Symmetric renal enhancement without hydronephrosis. No definite focal lesions.  Bowel: Normal in caliber . Colonic diverticulosis without evidence of inflammatory changes. The appendix is not visualized, however there are no regional inflammatory changes.  Pelvic viscera: Slight increase in size of a cystic lesion in the right adnexa measuring 2.6 x 1.8 cm, previously 2.1 x 1.4 cm, versus 2 adjacent smaller cysts. Status post hysterectomy.  Other: No abdominal or pelvic lymphadenopathy.  Bones: No aggressive-appearing osseous lesions.  Impression: 1. No acute findings. The appendix is not visualized, however there are no regional inflammatory changes. 2. Colonic diverticulosis without evidence of diverticulitis. 3. Slight increase in size of a cystic lesion in the right adnexa compared to CT 12/07/2017. Given the minimal increase in size this is likely benign, particularly if the patient is premenopausal. If the patient is postmenopausal, follow-up pelvic ultrasound is suggested.  Electronically Signed  by:  Alexie Riofrio, MD, [facility]iology Electronically Signed on:  11/05/2021 5:30 PM     Procedures Procedures (including critical care time)  Medications Ordered in UC Medications - No data to display  Initial Impression / Assessment and Plan / UC Course  I have reviewed the triage vital signs and the nursing notes.  Pertinent labs & imaging results that were available during my care of the patient were reviewed by me and considered in my medical decision making (see chart for details).   53 year old female with history of endometriosis status post abdominal hysterectomy, diverticulitis/diverticulosis presents for 2 to 3-day history of crampy left lower abdominal pain, nausea, reduced appetite, fatigue.  Reports urinary urgency a week ago but denies any urinary symptoms at this time.  No associated fever.  Symptoms are similar to when she had diverticulitis in the past..  Vitals are stable and normal. Patient is overall well-appearing.  No acute distress. On exam, has soft abdomen with tenderness mildly of the left upper quadrant and moderately of the left lower quadrant.  No rebound or guarding.  No CVA tenderness.  Chest clear.  Heart regular rate rhythm.  Advised patient of the limitations of urgent care. Explained that I do not have access to CT currently which could diagnose potential diverticulitis.  Explained to her what we do have including labs, urinalysis, and x-ray.  Patient would like to avoid going to the emergency department and would like to be worked up in urgent care setting.  CBC, CMP, UA and KUB ordered.   Patient declined ketorolac injection.   *** Final Clinical Impressions(s) / UC Diagnoses   Final diagnoses:  None   Discharge Instructions   None    ED Prescriptions   None    PDMP not reviewed this encounter.

## 2023-07-24 NOTE — Discharge Instructions (Addendum)
-   Your lab work was reassuring. - The x-ray was obtained to assess for possible kidney stones or constipation/bowel blockage.  The x-ray shows some calcifications in your pelvic region.  Radiologist cannot rule out kidney stones but thinks it is unlikely. -Your symptoms seem to be most consistent with a mild case of diverticulitis.  Since you would like to avoid the emergency department we can treat outpatient.  I sent 2 different antibiotics for you to take starting today.  You should be experiencing clinical improvement within 48 hours.  If you are not experiencing any improvement or symptoms worsen at all you need to go to the emergency department and have a CT scan and further labs done.  If at any point you develop worsening of symptoms, fever, excessive vomiting, weakness, black or bloody stool, dizziness, call 911 or go immediately to the ER. -Please make a follow-up appointment with your primary care provider

## 2023-10-15 ENCOUNTER — Other Ambulatory Visit: Payer: Self-pay | Admitting: Gastroenterology

## 2023-10-15 DIAGNOSIS — N9489 Other specified conditions associated with female genital organs and menstrual cycle: Secondary | ICD-10-CM

## 2023-10-18 ENCOUNTER — Ambulatory Visit
Admission: RE | Admit: 2023-10-18 | Discharge: 2023-10-18 | Disposition: A | Discharge status: Home / Self Care | Source: Ambulatory Visit | Attending: Gastroenterology | Admitting: Gastroenterology

## 2023-10-18 DIAGNOSIS — N9489 Other specified conditions associated with female genital organs and menstrual cycle: Secondary | ICD-10-CM | POA: Diagnosis present

## 2023-11-04 ENCOUNTER — Ambulatory Visit (INDEPENDENT_AMBULATORY_CARE_PROVIDER_SITE_OTHER)

## 2023-11-04 DIAGNOSIS — K640 First degree hemorrhoids: Secondary | ICD-10-CM | POA: Diagnosis not present

## 2023-11-04 DIAGNOSIS — R198 Other specified symptoms and signs involving the digestive system and abdomen: Secondary | ICD-10-CM | POA: Diagnosis not present

## 2023-11-04 DIAGNOSIS — R1032 Left lower quadrant pain: Secondary | ICD-10-CM | POA: Diagnosis present

## 2023-11-04 DIAGNOSIS — K5730 Diverticulosis of large intestine without perforation or abscess without bleeding: Secondary | ICD-10-CM | POA: Diagnosis not present

## 2023-12-11 ENCOUNTER — Ambulatory Visit

## 2023-12-11 ENCOUNTER — Inpatient Hospital Stay: Admission: RE | Admit: 2023-12-11 | Discharge: 2023-12-11 | Payer: Self-pay | Attending: Physician Assistant

## 2023-12-11 VITALS — BP 119/74 | HR 95 | Temp 100.8°F | Resp 14 | Ht 68.0 in | Wt 201.1 lb

## 2023-12-11 DIAGNOSIS — B349 Viral infection, unspecified: Secondary | ICD-10-CM | POA: Diagnosis not present

## 2023-12-11 DIAGNOSIS — J029 Acute pharyngitis, unspecified: Secondary | ICD-10-CM | POA: Diagnosis not present

## 2023-12-11 DIAGNOSIS — R509 Fever, unspecified: Secondary | ICD-10-CM | POA: Diagnosis not present

## 2023-12-11 DIAGNOSIS — R051 Acute cough: Secondary | ICD-10-CM

## 2023-12-11 LAB — POC COVID19/FLU A&B COMBO
Covid Antigen, POC: NEGATIVE
Influenza A Antigen, POC: NEGATIVE
Influenza B Antigen, POC: NEGATIVE

## 2023-12-11 LAB — POCT RAPID STREP A (OFFICE): Rapid Strep A Screen: NEGATIVE

## 2023-12-11 MED ORDER — PROMETHAZINE-DM 6.25-15 MG/5ML PO SYRP: 5.0000 mL | ORAL_SOLUTION | Freq: Four times a day (QID) | ORAL | 0 refills | Status: AC | PRN

## 2023-12-11 MED ORDER — ACETAMINOPHEN 325 MG PO TABS
650.0000 mg | ORAL_TABLET | Freq: Once | ORAL | Status: AC
  Administered 2023-12-11: 650 mg via ORAL

## 2023-12-11 NOTE — Discharge Instructions (Addendum)
-  Negative COVID, flu, strep and normal chest x-ray.  URI/COLD SYMPTOMS: Your exam today is consistent with a viral illness. Antibiotics are not indicated at this time. Use medications as directed, including cough syrup, nasal saline, and decongestants. Your symptoms should improve over the next few days and resolve within 7-10 days. Increase rest and fluids. F/u if symptoms worsen or predominate such as sore throat, ear pain, productive cough, shortness of breath, or if you develop high fevers or worsening fatigue over the next several days.

## 2023-12-11 NOTE — ED Provider Notes (Signed)
 [facility]    CSN: [id number] Arrival date & time: 12/11/23  0935      History   Chief Complaint Chief Complaint  Patient presents with   Generalized Body Aches    APPOINTMENT    HPI [name] [name] is a 53 y.o. female presenting for fever, fatigue, cough, congestion, mild sore throat and body aches x 2 days.   Denies ear pain, sinus pain, chest pain, wheezing, shortness of breath, abdominal pain, vomiting or diarrhea.  Patient has been exposed to her ill husband. 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 Patient has been taking over-the-counter meds. No other complaints.   HPI  Past Medical History:  Diagnosis Date   History of endometriosis    Rosacea    UTI (urinary tract infection) 06/2023   Varicose veins     Patient Active Problem List   Diagnosis Date Noted   Varicose veins of bilateral lower extremities with other complications 01/14/2012    Past Surgical History:  Procedure Laterality Date   ABDOMINAL HYSTERECTOMY  2005   adenoid surgery  1978   COLONOSCOPY WITH PROPOFOL  N/A 02/01/2018   Procedure: COLONOSCOPY WITH PROPOFOL ;  Surgeon: Gaylyn Gladis PENNER, MD;  Location: [facility];  Service: Endoscopy;  Laterality: N/A;   ENDOVENOUS ABLATION SAPHENOUS VEIN W/ LASER  04-09-2010   left small saphenous vein and stab phlebectomy 10-20 incisions left leg by Krystal Doing MD   LAPAROSCOPY  2004    OB History   No obstetric history on file.      Home Medications    Prior to Admission medications   Medication Sig Start Date End Date Taking? Authorizing Provider  promethazine -dextromethorphan (PROMETHAZINE -DM) 6.25-15 MG/5ML syrup Take 5 mLs by mouth 4 (four) times daily as needed. 12/11/23  Yes Arvis Jolan NOVAK, PA-C  Cholecalciferol (VITAMIN D3) 50 MCG (2000 UT) CAPS Vitamin D3    [provider]  estradiol (ESTRACE) 1 MG tablet Take 1.5 mg by mouth daily. 06/01/19   [provider]  FINACEA 15 % FOAM Apply topically daily.     [provider]  fluticasone (FLONASE) 50 MCG/ACT nasal spray Place 1 spray into both nostrils daily.    [provider]  loratadine (CLARITIN) 10 MG tablet Take 10 mg by mouth daily.    [provider]  psyllium (METAMUCIL) 58.6 % powder Take 1 packet by mouth daily.    [provider]    Family History Family History  Problem Relation Age of Onset   Heart disease Father     Social History Social History   Tobacco Use   Smoking status: Never    Passive exposure: Never   Smokeless tobacco: Never  Vaping Use   Vaping status: Never Used  Substance Use Topics   Alcohol use: Yes    Comment: 1-2 drinks/week   Drug use: No     Allergies   Doxycycline and Penicillins   Review of Systems Review of Systems  Constitutional:  Positive for fatigue and fever. Negative for chills and diaphoresis.  HENT:  Positive for congestion, rhinorrhea and sore throat. Negative for ear pain, sinus pressure and sinus pain.   Respiratory:  Positive for cough. Negative for shortness of breath and wheezing.   Cardiovascular:  Negative for chest pain.  Gastrointestinal:  Negative for abdominal pain, nausea and vomiting.  Musculoskeletal:  Positive for myalgias.  Skin:  Negative for rash.  Neurological:  Negative for weakness and headaches.  Hematological:  Negative for adenopathy.  Physical Exam Triage Vital Signs ED Triage Vitals  Encounter Vitals Group     BP 12/11/23 1001 119/74     Girls Systolic BP Percentile --      Girls Diastolic BP Percentile --      Boys Systolic BP Percentile --      Boys Diastolic BP Percentile --      Pulse Rate 12/11/23 1001 95     Resp 12/11/23 1001 14     Temp 12/11/23 1001 (!) 100.8 F (38.2 C)     Temp Source 12/11/23 1001 Oral     SpO2 12/11/23 1001 95 %     Weight 12/11/23 1000 201 lb 1 oz (91.2 kg)     Height 12/11/23 1000 5' 8 (1.727 m)     Head Circumference --      Peak Flow --      Pain Score 12/11/23  1000 6     Pain Loc --      Pain Education --      Exclude from Growth Chart --    No data found.  Updated Vital Signs BP 119/74 (BP Location: Right Arm)   Pulse 95   Temp (!) 100.8 F (38.2 C) (Oral)   Resp 14   Ht 5' 8 (1.727 m)   Wt 201 lb 1 oz (91.2 kg)   SpO2 95%   BMI 30.57 kg/m    Physical Exam Vitals and nursing note reviewed.  Constitutional:      General: She is not in acute distress.    Appearance: Normal appearance. She is ill-appearing. She is not toxic-appearing.  HENT:     Head: Normocephalic and atraumatic.     Nose: Congestion present.     Mouth/Throat:     Mouth: Mucous membranes are moist.     Pharynx: Oropharynx is clear. Posterior oropharyngeal erythema present.  Eyes:     General: No scleral icterus.       Right eye: No discharge.        Left eye: No discharge.     Conjunctiva/sclera: Conjunctivae normal.  Cardiovascular:     Rate and Rhythm: Normal rate and regular rhythm.     Heart sounds: Normal heart sounds.  Pulmonary:     Effort: Pulmonary effort is normal. No respiratory distress.     Breath sounds: Normal breath sounds.  Musculoskeletal:     Cervical back: Neck supple.  Skin:    General: Skin is dry.  Neurological:     General: No focal deficit present.     Mental Status: She is alert. Mental status is at baseline.     Motor: No weakness.     Gait: Gait normal.  Psychiatric:        Mood and Affect: Mood normal.        Behavior: Behavior normal.      UC Treatments / Results  Labs (all labs ordered are listed, but only abnormal results are displayed) Labs Reviewed  POC COVID19/FLU A&B COMBO - Normal  POCT RAPID STREP A (OFFICE) - Normal    EKG   Radiology DG Chest 2 View Result Date: 12/11/2023 EXAM: 2 VIEW(S) XRAY OF THE CHEST 12/11/2023 10:52:00 AM COMPARISON: None available. CLINICAL HISTORY: fever and cough x 2 days FINDINGS: LUNGS AND PLEURA: No focal pulmonary opacity. No pleural effusion. No pneumothorax. HEART  AND MEDIASTINUM: No acute abnormality of the cardiac and mediastinal silhouettes. BONES AND SOFT TISSUES: No acute fracture or destructive lesion. Multilevel thoracic osteophytosis. IMPRESSION:  1. No acute cardiopulmonary process. Electronically signed by: Waddell Calk MD 12/11/2023 11:05 AM EST RP Workstation: [id number]    Procedures Procedures (including critical care time)  Medications Ordered in UC Medications  acetaminophen  (TYLENOL ) tablet 650 mg (650 mg Oral Given 12/11/23 1006)    Initial Impression / Assessment and Plan / UC Course  I have reviewed the triage vital signs and the nursing notes.  Pertinent labs & imaging results that were available during my care of the patient were reviewed by me and considered in my medical decision making (see chart for details).   53 year old female presents for 2-day history of fever, fatigue, cough, congestion and sore throat.  Her husband is also sick and has been ill for a couple weeks.  Temp 100.8 degrees.  Other vitals normal and stable.  She is mildly ill but nontoxic-appearing.  On exam she has nasal congestion and mild erythema posterior pharynx.  Chest is clear.  Heart regular rate and rhythm.  Patient was given Tylenol  by nursing staff.  COVID/flu rapid testing is negative.  Rapid strep test and chest x-ray obtained.  Negative rapid strep test and chest x-ray.  Reviewed all results with patient.  Advised that her symptoms are consistent with viral illness.  Supportive care encouraged.  Sent Promethazine  DM.  Advised to continue ibuprofen and Tylenol .  Monitor closely.  If fever is not breaking in a few days or symptoms acutely worsen she should seek reevaluation.  Work note was given.  Acute illness with systemic symptoms.   Final Clinical Impressions(s) / UC Diagnoses   Final diagnoses:  Acute cough  Fever, unspecified  Viral illness  Sore throat     Discharge Instructions      - Negative COVID, flu, strep and  normal chest x-ray.  URI/COLD SYMPTOMS: Your exam today is consistent with a viral illness. Antibiotics are not indicated at this time. Use medications as directed, including cough syrup, nasal saline, and decongestants. Your symptoms should improve over the next few days and resolve within 7-10 days. Increase rest and fluids. F/u if symptoms worsen or predominate such as sore throat, ear pain, productive cough, shortness of breath, or if you develop high fevers or worsening fatigue over the next several days.       ED Prescriptions     Medication Sig Dispense Auth. Provider   promethazine -dextromethorphan (PROMETHAZINE -DM) 6.25-15 MG/5ML syrup Take 5 mLs by mouth 4 (four) times daily as needed. 118 mL Arvis Jolan NOVAK, PA-C      PDMP not reviewed this encounter.   Arvis Jolan NOVAK, PA-C 12/11/23 1113

## 2023-12-11 NOTE — ED Triage Notes (Signed)
 Patient c/o cough, chills, bodyaches, headaches, and congestion that started Thursday night. Patient unsure of fevers.
